# Patient Record
Sex: Female | Born: 2005 | Race: Black or African American | Hispanic: No | Marital: Single | Smoking: Never smoker
Health system: Southern US, Community
[De-identification: ages and names within clinical notes are randomized; demographics above are authoritative.]

## PROBLEM LIST (undated history)

## (undated) DIAGNOSIS — L309 Dermatitis, unspecified: Secondary | ICD-10-CM

## (undated) DIAGNOSIS — J45909 Unspecified asthma, uncomplicated: Secondary | ICD-10-CM

---
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
## 2023-01-02 DIAGNOSIS — J029 Acute pharyngitis, unspecified: Secondary | ICD-10-CM | POA: Diagnosis not present

## 2023-01-02 DIAGNOSIS — F4323 Adjustment disorder with mixed anxiety and depressed mood: Secondary | ICD-10-CM | POA: Diagnosis not present

## 2023-01-02 DIAGNOSIS — F900 Attention-deficit hyperactivity disorder, predominantly inattentive type: Secondary | ICD-10-CM | POA: Diagnosis not present

## 2023-02-24 DIAGNOSIS — Z68.41 Body mass index (BMI) pediatric, 85th percentile to less than 95th percentile for age: Secondary | ICD-10-CM | POA: Diagnosis not present

## 2023-02-24 DIAGNOSIS — Z7182 Exercise counseling: Secondary | ICD-10-CM | POA: Diagnosis not present

## 2023-02-24 DIAGNOSIS — Z713 Dietary counseling and surveillance: Secondary | ICD-10-CM | POA: Diagnosis not present

## 2023-02-24 DIAGNOSIS — Z0000 Encounter for general adult medical examination without abnormal findings: Secondary | ICD-10-CM | POA: Diagnosis not present

## 2023-03-17 ENCOUNTER — Encounter: Payer: Self-pay | Admitting: *Deleted

## 2023-04-01 DIAGNOSIS — N760 Acute vaginitis: Secondary | ICD-10-CM | POA: Diagnosis not present

## 2023-04-01 DIAGNOSIS — B3731 Acute candidiasis of vulva and vagina: Secondary | ICD-10-CM | POA: Diagnosis not present

## 2023-04-01 DIAGNOSIS — Z3202 Encounter for pregnancy test, result negative: Secondary | ICD-10-CM | POA: Diagnosis not present

## 2023-04-01 DIAGNOSIS — Z113 Encounter for screening for infections with a predominantly sexual mode of transmission: Secondary | ICD-10-CM | POA: Diagnosis not present

## 2023-04-22 ENCOUNTER — Ambulatory Visit: Payer: Self-pay | Admitting: Advanced Practice Midwife

## 2023-04-22 NOTE — Progress Notes (Deleted)
   GYNECOLOGY PROGRESS NOTE  History:  17 y.o. No obstetric history on file. presents to [facility] office today for *** gyn visit. She reports *****.  She denies h/a, dizziness, shortness of breath, n/v, or fever/chills.    The following portions of the patient's history were reviewed and updated as appropriate: allergies, current medications, past family history, past medical history, past social history, past surgical history and problem list. Last pap smear on *** was normal, *** HRHPV.  Health Maintenance Due  Topic Date Due   HPV VACCINES (1 - 2-dose series) Never done   HIV Screening  Never done   Hepatitis C Screening  Never done   COVID-19 Vaccine (2 - 2023-24 season) 08/02/2022   DTaP/Tdap/Td (1 - Tdap) Never done     Review of Systems:  Pertinent items are noted in HPI.   Objective:  Physical Exam There were no vitals taken for this visit. VS reviewed, nursing note reviewed,  Constitutional: well developed, well nourished, no distress HEENT: normocephalic CV: normal rate Pulm/chest wall: normal effort Breast Exam: deferred Abdomen: soft Neuro: alert and oriented x 3 Skin: warm, dry Psych: affect normal Pelvic exam: Cervix pink, visually closed, without lesion, scant white creamy discharge, vaginal walls and external genitalia normal Bimanual exam: Cervix 0/long/high, firm, anterior, neg CMT, uterus nontender, nonenlarged, adnexa without tenderness, enlargement, or mass  Assessment & Plan:  There are no diagnoses linked to this encounter.  No follow-ups on file.   Sharen Counter, CNM 9:25 AM

## 2023-11-01 DIAGNOSIS — R21 Rash and other nonspecific skin eruption: Secondary | ICD-10-CM | POA: Diagnosis not present
